# Patient Record
Sex: Female | Born: 1955 | Race: White | Hispanic: No | Marital: Married
Health system: Southern US, Community
[De-identification: ages and names within clinical notes are randomized; demographics above are authoritative.]

---

## 2012-06-30 ENCOUNTER — Emergency Department: Payer: Self-pay | Admitting: Emergency Medicine

## 2012-06-30 LAB — HEPATIC FUNCTION PANEL A (ARMC)
Albumin: 4.2 g/dL (ref 3.4–5.0)
Bilirubin, Direct: 0.5 mg/dL — ABNORMAL HIGH (ref 0.00–0.20)
Bilirubin,Total: 1.1 mg/dL — ABNORMAL HIGH (ref 0.2–1.0)
Total Protein: 7.6 g/dL (ref 6.4–8.2)

## 2012-06-30 LAB — CBC
HCT: 37.7 % (ref 35.0–47.0)
HGB: 13 g/dL (ref 12.0–16.0)
MCH: 32.3 pg (ref 26.0–34.0)
MCHC: 34.6 g/dL (ref 32.0–36.0)
MCV: 93 fL (ref 80–100)
Platelet: 243 10*3/uL (ref 150–440)
RDW: 13.3 % (ref 11.5–14.5)
WBC: 9.3 10*3/uL (ref 3.6–11.0)

## 2012-06-30 LAB — URINALYSIS, COMPLETE
Bilirubin,UR: NEGATIVE
Blood: NEGATIVE
Ketone: NEGATIVE
Nitrite: NEGATIVE
Protein: NEGATIVE
Specific Gravity: 1.02 (ref 1.003–1.030)
Squamous Epithelial: 2
WBC UR: 3 /HPF (ref 0–5)

## 2012-06-30 LAB — BASIC METABOLIC PANEL
Anion Gap: 8 (ref 7–16)
Chloride: 108 mmol/L — ABNORMAL HIGH (ref 98–107)
Co2: 25 mmol/L (ref 21–32)
Creatinine: 0.78 mg/dL (ref 0.60–1.30)
Osmolality: 282 (ref 275–301)
Potassium: 3.5 mmol/L (ref 3.5–5.1)
Sodium: 141 mmol/L (ref 136–145)

## 2012-07-03 ENCOUNTER — Inpatient Hospital Stay: Payer: Self-pay | Admitting: Surgery

## 2012-07-03 ENCOUNTER — Ambulatory Visit: Payer: Self-pay | Admitting: Surgery

## 2012-07-03 LAB — COMPREHENSIVE METABOLIC PANEL
Albumin: 4.1 g/dL (ref 3.4–5.0)
Anion Gap: 8 (ref 7–16)
BUN: 11 mg/dL (ref 7–18)
Bilirubin,Total: 1.1 mg/dL — ABNORMAL HIGH (ref 0.2–1.0)
Calcium, Total: 9.6 mg/dL (ref 8.5–10.1)
Chloride: 103 mmol/L (ref 98–107)
Co2: 29 mmol/L (ref 21–32)
Creatinine: 1.01 mg/dL (ref 0.60–1.30)
EGFR (African American): >60
Glucose: 114 mg/dL — ABNORMAL HIGH (ref 65–99)
Osmolality: 280 (ref 275–301)
SGPT (ALT): 506 U/L — ABNORMAL HIGH (ref 12–78)
Sodium: 140 mmol/L (ref 136–145)
Total Protein: 8.4 g/dL — ABNORMAL HIGH (ref 6.4–8.2)

## 2012-07-03 LAB — CBC WITH DIFFERENTIAL/PLATELET
Eosinophil #: 0.1 10*3/uL (ref 0.0–0.7)
HCT: 40.3 % (ref 35.0–47.0)
Lymphocyte #: 1.8 10*3/uL (ref 1.0–3.6)
Lymphocyte %: 34 %
MCV: 94 fL (ref 80–100)
Monocyte #: 0.4 x10 3/mm (ref 0.2–0.9)
Monocyte %: 8.1 %
Neutrophil %: 55.9 %
RBC: 4.31 10*6/uL (ref 3.80–5.20)
RDW: 13.3 % (ref 11.5–14.5)
WBC: 5.3 10*3/uL (ref 3.6–11.0)

## 2012-07-03 LAB — CREATININE, SERUM
EGFR (African American): >60
EGFR (Non-African Amer.): >60

## 2012-07-03 LAB — LIPASE, BLOOD: Lipase: 99 U/L (ref 73–393)

## 2012-07-05 LAB — CBC WITH DIFFERENTIAL/PLATELET
Basophil #: 0.1 10*3/uL (ref 0.0–0.1)
Basophil %: 0.7 %
HCT: 37.6 % (ref 35.0–47.0)
HGB: 12.5 g/dL (ref 12.0–16.0)
Lymphocyte #: 2.1 10*3/uL (ref 1.0–3.6)
Lymphocyte %: 24.7 %
MCHC: 33.1 g/dL (ref 32.0–36.0)
Monocyte #: 0.6 x10 3/mm (ref 0.2–0.9)
Monocyte %: 6.9 %
Neutrophil #: 5.7 10*3/uL (ref 1.4–6.5)
Neutrophil %: 67.1 %
Platelet: 228 10*3/uL (ref 150–440)
WBC: 8.5 10*3/uL (ref 3.6–11.0)

## 2012-07-05 LAB — BASIC METABOLIC PANEL
Anion Gap: 7 (ref 7–16)
BUN: 10 mg/dL (ref 7–18)
Calcium, Total: 8.7 mg/dL (ref 8.5–10.1)
Co2: 24 mmol/L (ref 21–32)
Creatinine: 0.91 mg/dL (ref 0.60–1.30)
EGFR (African American): >60
EGFR (Non-African Amer.): >60
Glucose: 100 mg/dL — ABNORMAL HIGH (ref 65–99)
Osmolality: 277 (ref 275–301)
Potassium: 3.7 mmol/L (ref 3.5–5.1)
Sodium: 139 mmol/L (ref 136–145)

## 2012-07-05 LAB — HEPATIC FUNCTION PANEL A (ARMC)
Alkaline Phosphatase: 257 U/L — ABNORMAL HIGH (ref 50–136)
Bilirubin, Direct: 0.4 mg/dL — ABNORMAL HIGH (ref 0.00–0.20)
Bilirubin,Total: 1.4 mg/dL — ABNORMAL HIGH (ref 0.2–1.0)
SGOT(AST): 222 U/L — ABNORMAL HIGH (ref 15–37)
SGPT (ALT): 425 U/L — ABNORMAL HIGH (ref 12–78)
Total Protein: 7.3 g/dL (ref 6.4–8.2)

## 2012-07-06 LAB — CBC WITH DIFFERENTIAL/PLATELET
Basophil #: 0 10*3/uL (ref 0.0–0.1)
Basophil %: 0.6 %
Eosinophil #: 0.1 10*3/uL (ref 0.0–0.7)
HCT: 35.7 % (ref 35.0–47.0)
Lymphocyte %: 28.7 %
MCH: 32.3 pg (ref 26.0–34.0)
MCHC: 34 g/dL (ref 32.0–36.0)
Monocyte #: 0.6 x10 3/mm (ref 0.2–0.9)
Monocyte %: 7.7 %
Neutrophil #: 4.8 10*3/uL (ref 1.4–6.5)
RBC: 3.74 10*6/uL — ABNORMAL LOW (ref 3.80–5.20)
RDW: 13.7 % (ref 11.5–14.5)
WBC: 7.7 10*3/uL (ref 3.6–11.0)

## 2012-07-06 LAB — BASIC METABOLIC PANEL
Creatinine: 0.86 mg/dL (ref 0.60–1.30)
EGFR (Non-African Amer.): >60
Glucose: 105 mg/dL — ABNORMAL HIGH (ref 65–99)
Osmolality: 280 (ref 275–301)
Sodium: 141 mmol/L (ref 136–145)

## 2012-07-06 LAB — HEPATIC FUNCTION PANEL A (ARMC)
Bilirubin, Direct: 0.4 mg/dL — ABNORMAL HIGH (ref 0.00–0.20)
Bilirubin,Total: 1.3 mg/dL — ABNORMAL HIGH (ref 0.2–1.0)
SGOT(AST): 123 U/L — ABNORMAL HIGH (ref 15–37)
SGPT (ALT): 310 U/L — ABNORMAL HIGH (ref 12–78)

## 2012-07-06 LAB — LIPASE, BLOOD: Lipase: 107 U/L (ref 73–393)

## 2014-07-07 IMAGING — CT CT STONE STUDY
1 of 2 series · 15 of 32 positions shown, 19 images · non-contrast
Comparison: none

REASON FOR EXAM: right flank pain, no hx of stones
COMMENTS:

PROCEDURE:     CT ABDOMEN/PELVIS WO (STONE) 06/30/2012 [DATE]
RESULT:
TECHNIQUE: Helical noncontrasted 3 mm sections were obtained from the lung
bases through the pubic symphysis.

[Series 2: 3mm soft tissue · axial · 0.68mm/px · z∈[-432,-27]mm · 15 of 149 slices shown, 19 images]
[im 7/149  soft-tissue]
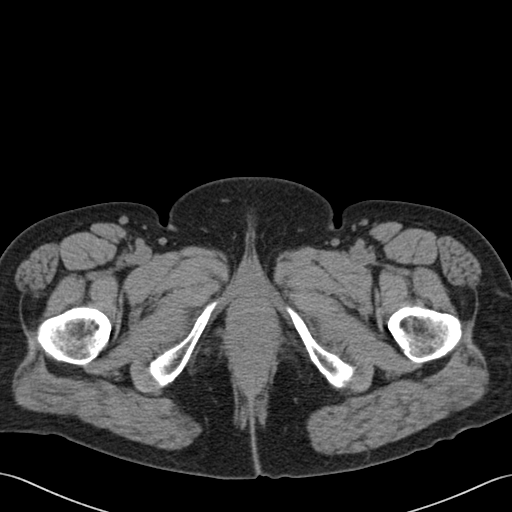
[im 7/149  bone]
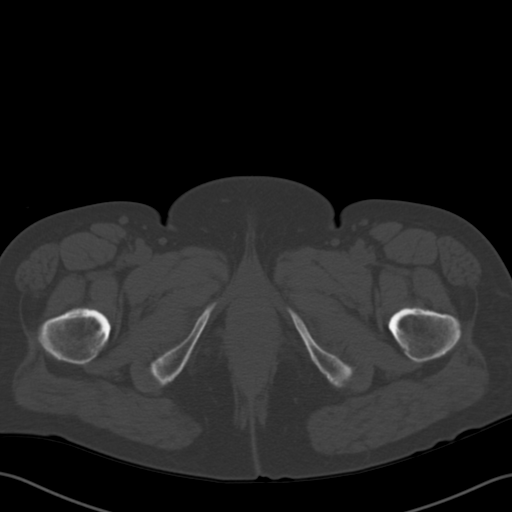
[im 20/149  soft-tissue]
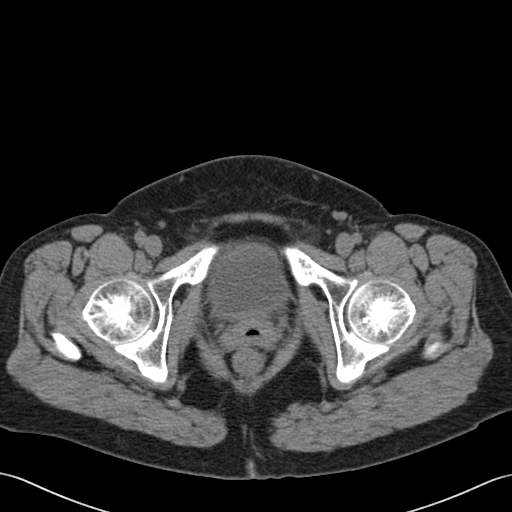
[im 33/149  soft-tissue]
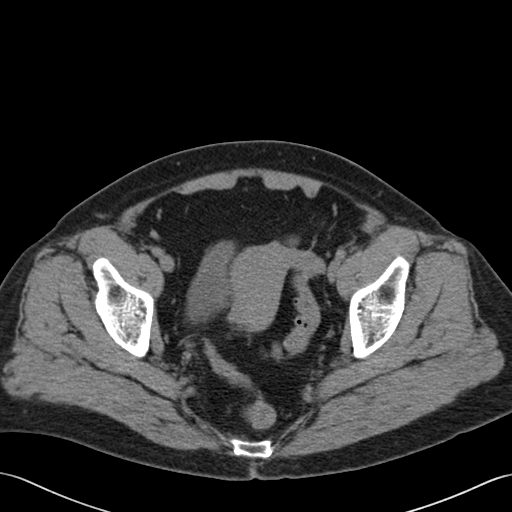
[im 39/149  soft-tissue]
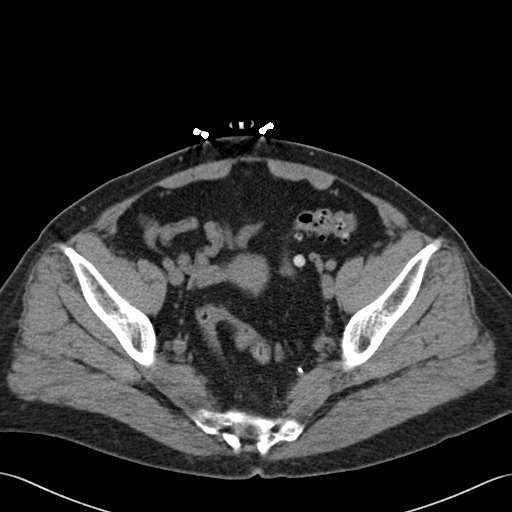
[im 52/149  soft-tissue]
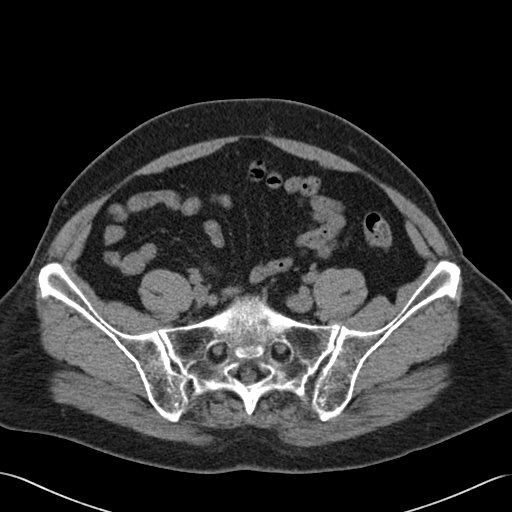
[im 65/149  soft-tissue]
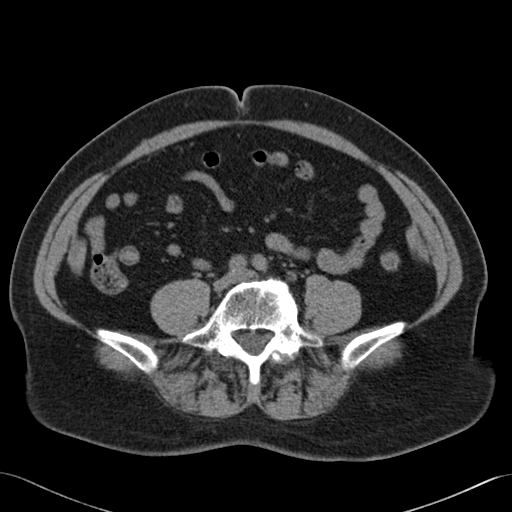
[im 78/149  soft-tissue]
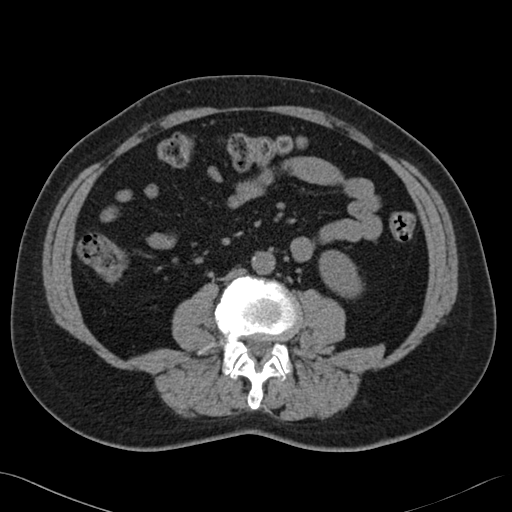
[im 84/149  soft-tissue]
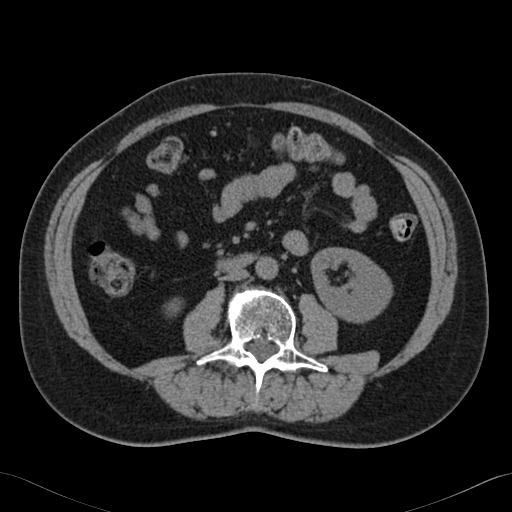
[im 97/149  soft-tissue]
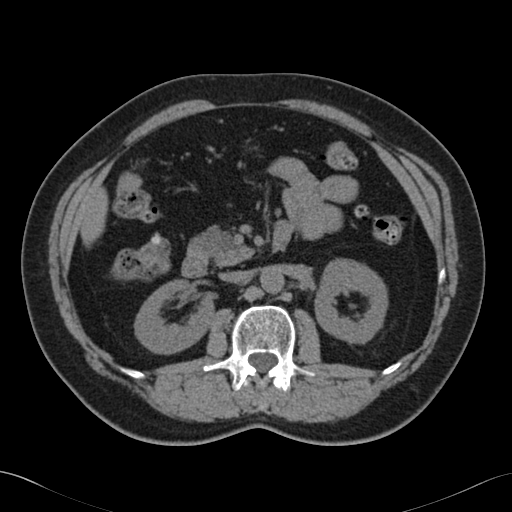
[im 97/149  bone]
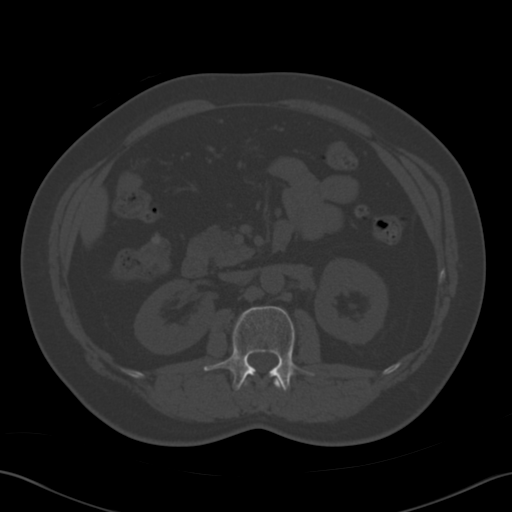
[im 110/149  soft-tissue]
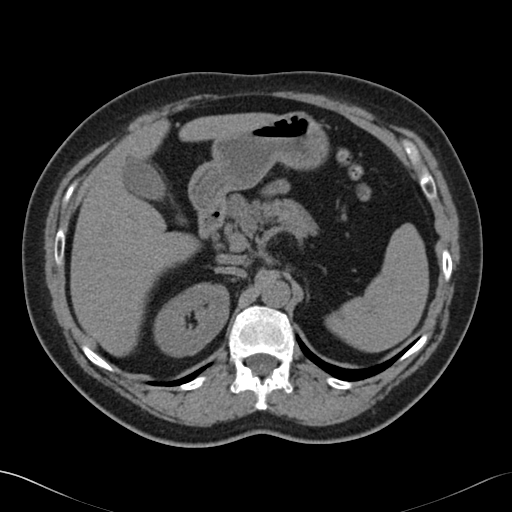
[im 116/149  soft-tissue]
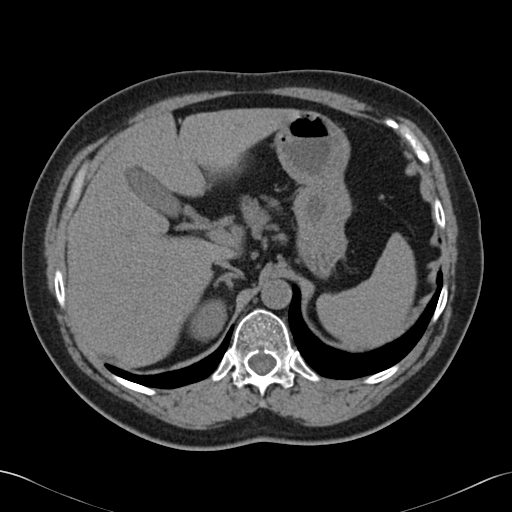
[im 123/149  lung]
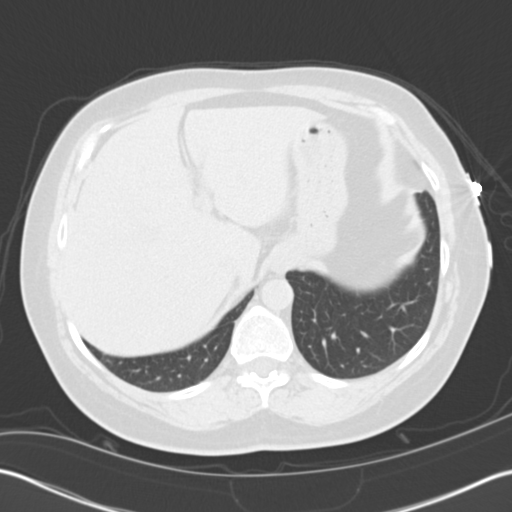
[im 129/149  soft-tissue]
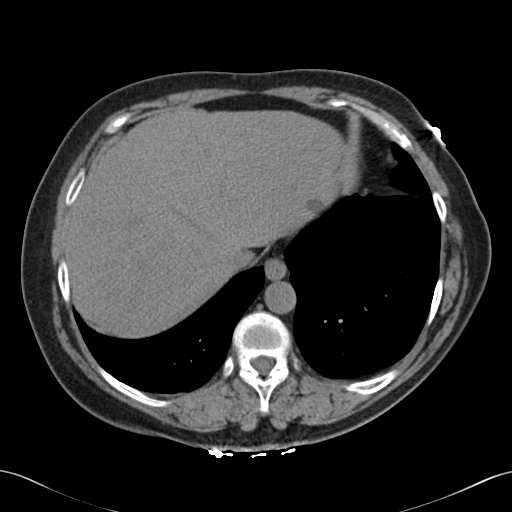
[im 129/149  lung]
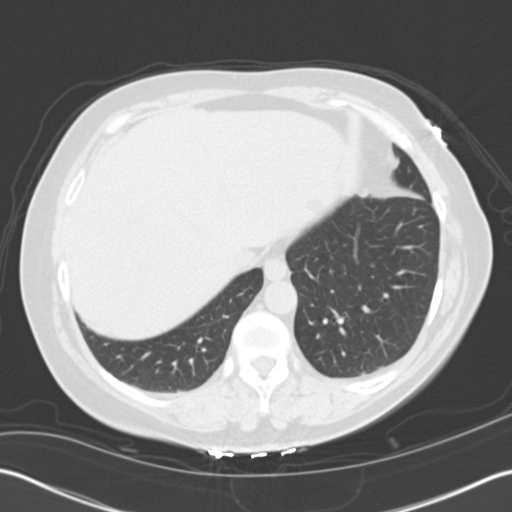
[im 136/149  lung]
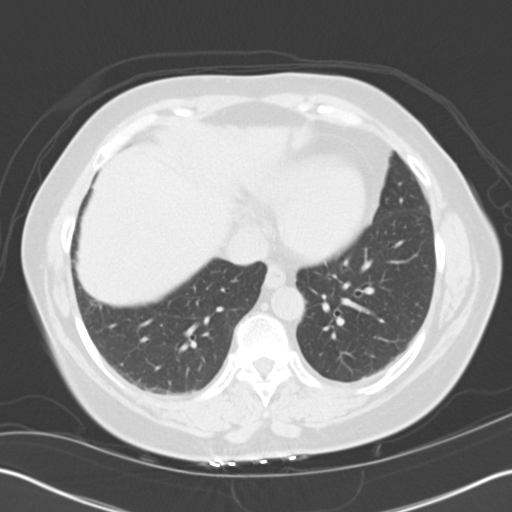
[im 142/149  soft-tissue]
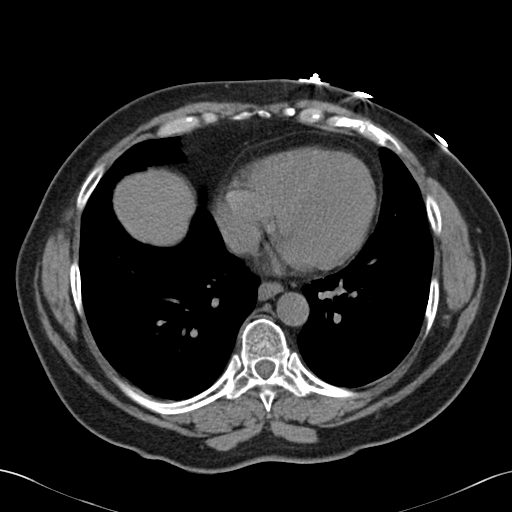
[im 142/149  lung]
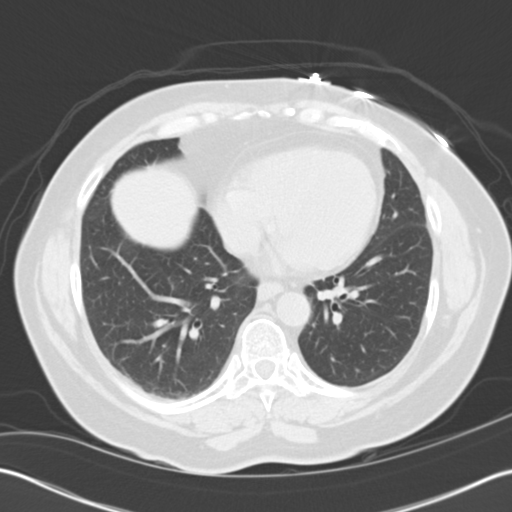

[15 of 32 positions shown; findings below may reference images not displayed]

FINDINGS: The lung bases are unremarkable.

Evaluation of the liver demonstrates a small 1 cm low attenuating focus in
the dome of the left lobe of the liver likely representing a cyst.

Noncontrast evaluation of the spleen, adrenals, pancreas, and kidneys are
unremarkable. Calcified gallstones are identified within the gallbladder.
There is a suggestion of minimal pericholecystic stranding which may
represent mild inflammation. There is no CT evidence of bowel obstruction,
enteritis, or colitis. There is no evidence of an abdominal aortic aneurysm,
free fluid or loculated fluid collections.
IMPRESSION: 1. Cholelithiasis with possible mild pericholecystic stranding. Abdominal
ultrasound may be helpful to further characterize this finding.
2. No furthers abnormalities are appreciated.
3. Dr. Kamran of the Emergency Department was informed of these findings via
a preliminary faxed report.

## 2014-09-11 NOTE — Op Note (Signed)
PATIENT NAME:  Wendy Escobar, Kenlee F MR#:  [id number] DATE OF BIRTH:  16-Dec-1955  DATE OF PROCEDURE:  07/04/2012  PREOPERATIVE DIAGNOSIS: Cholecystitis and cholelithiasis.   POSTOPERATIVE DIAGNOSIS: Cholecystitis, cholelithiasis and choledocholithiasis.   SURGEON: Dr. Michela PitcherEly   OPERATIVE PROCEDURE: Laparoscopic cholecystectomy with cholangiography.   ANESTHESIA: General.   OPERATIVE PROCEDURE: With the patient in the supine position after induction of appropriate general anesthesia, the patient's abdomen was prepped with ChloraPrep and draped with sterile towels. The patient was placed in the head down, feet up position. A small transverse infraumbilical incision was made at the site of her previous BTL and carried down through the subcutaneous tissue with Bovie electrocautery. Midline fascia was identified. The abdomen was cannulated with a Veress needle with CO2 insufflated to appropriate pressure measurements. When approximately 2.5 L of CO2 were instilled, the Veress needle was withdrawn. An 11-mm [employer] port was inserted into the peritoneal cavity. Intraperitoneal position was confirmed, and CO2 was reinsufflated. The patient was placed in the head up, feet down position with a slight to left side. A subxiphoid transverse incision was made and an 11-mm port inserted under direct vision. Two lateral ports 5 mm in size were inserted under direct vision. The gallbladder was tense and distended. It was obviously edematous. It was drained of approximately 50 mL of dirty motor oil-colored bile. The gallbladder was retracted superiorly and laterally, exposing the hepatoduodenal ligament. The cystic duct and cystic artery were identified. The cystic duct was quite small. It was clipped on the gallbladder side and opened. An on-table cholangiogram using dynamic fluoroscopy revealed free flow of dye into the duodenum. There did appear to be distal obstruction floating in the common bile duct. No other  obstruction was identified. Intrahepatic radicles were seen. The catheter was withdrawn. The cystic duct was triply clipped on the common duct side and divided. The cystic artery was doubly clipped and divided. The gallbladder was then dissected free from its bed and delivered using hook and cautery apparatus. Once the gallbladder was free, the gallbladder was retrieved with an EndoCatch apparatus in the subxiphoid incision. The abdomen was then copiously irrigated. The upper midline defect was closed with figure-of-eight sutures of 0 Vicryl closed with a suture passer. The abdomen was then desufflated. All skin incisions were closed with 5-0 nylon. The area was infiltrated with 0.25% Marcaine for postoperative pain control. Sterile dressings were applied. The patient was returned to the recovery room having tolerated the procedure well. Sponge, instrument and needle counts were correct x2 in the Operating Room.    ____________________________ Carmie Endalph L. Ely III, MD rle:lg D: 07/04/2012 15:42:42 ET T: 07/04/2012 16:47:00 ET JOB#: [id number]  cc: Carmie Endalph L. Ely III, MD, <Dictator> [facility] Quentin OreALPH L ELY MD ELECTRONICALLY SIGNED 07/12/2012 17:19

## 2014-09-11 NOTE — H&P (Signed)
PATIENT NAME:  Wendy KassSTANLEY, Wendy F MR#:  [id number] DATE OF BIRTH:  [birth year]  DATE OF ADMISSION:  07/03/2012  CHIEF COMPLAINT: Right back pain.   HISTORY OF PRESENT ILLNESS: This is a 59 year old female patient who started having right back pain 4 or 5 days ago, on Saturday. She went to the Emergency Room on Sunday. She has been nauseated, had a single emesis on Sunday, none since. She is slightly improved, but when she was in the Emergency Room she had elevated liver function tests and since then has had dark urine. She has not had a bowel movement and is constipated; therefore, we have no information about acholic stools. She denies prior episodes prior to Saturday. States that her pain is mostly in her back, but somewhat in her right upper quadrant. She has had no fevers or chills and relates this to fatty food intolerance making it worse.   PAST MEDICAL HISTORY: None.   PAST SURGICAL HISTORY: Tubal ligation.   ALLERGIES: No known drug allergies.   MEDICATIONS: None.   FAMILY HISTORY: Noncontributory.   SOCIAL HISTORY: She stopped smoking 1 year ago, has a 20 pack-year history, does not drink alcohol and is employed.   REVIEW OF SYSTEMS: Ten system review has been performed and negative and documented in the office chart.   PHYSICAL EXAMINATION: GENERAL: Healthy-appearing female patient.  VITAL SIGNS: Stable. She is afebrile.  HEENT: No scleral icterus.  NECK: No palpable neck nodes.  CHEST: Clear to auscultation.  CARDIAC: Regular rate and rhythm.  ABDOMEN: Soft, minimally tender in the right upper quadrant with a negative Murphy's sign. No mass palpable. An infraumbilical scar is noted.  EXTREMITIES: Without edema. Calves are nontender.  NEUROLOGIC: Grossly intact.  INTEGUMENT: No jaundice.   LABORATORY DATA: Values drawn in the ED were elevated; AST, ALT and alkaline phosphatase.   RADIOLOGIC DATA: Ultrasound showed gallstones.   ASSESSMENT AND PLAN: This is a patient with  probable choledocholithiasis by history. She had elevated liver function tests and now has dark urine. My recommendations are to repeat her labs today and if they are worse or persistently elevated she should go back to the hospital for admission. We will call her with those results. I discussed with she and her husband the rationale for surgical admission with planned laparoscopic cholecystectomy with cholangiography during that admission with the possibility of a preoperative ERCP depending on the results of the liver function tests. We will call her later on today when we get those results. ____________________________ Adah Salvageichard E. Excell Seltzerooper, MD rec:sb D: 07/03/2012 10:17:35 ET T: 07/03/2012 10:25:30 ET JOB#: [id number]  cc: Adah Salvageichard E. Excell Seltzerooper, MD, <Dictator> Lattie HawICHARD E Makalah Asberry MD ELECTRONICALLY SIGNED 07/05/2012 20:42

## 2014-09-11 NOTE — Discharge Summary (Signed)
PATIENT NAME:  Wendy KassSTANLEY, Wendy F MR#:  [id number] DATE OF BIRTH:  [birth year]  DATE OF ADMISSION:  07/03/2012 DATE OF DISCHARGE:  07/07/2012  HISTORY OF PRESENT ILLNESS:  The patient is very pleasant 59 year old woman admitted from the office with symptoms consistent with biliary colic, possible acute cholecystitis and possible common bile duct obstruction. She had had symptoms for several days, seen in the Emergency Room and referred to the office for further evaluation. Office laboratory values revealed a slightly elevated alkaline phosphatase at 242, elevated transaminases to 225 and 506 with an elevated bilirubin of 1.1. White blood cell count was normal. She was admitted to the hospital, placed on IV antibiotics. After appropriate preoperative preparation and informed consent, she was taken to surgery on the morning of 07/04/2012, where she underwent a laparoscopic cholecystectomy with cholangiography. She did have evidence of acute cholecystitis and cholangiography did identify what appeared to be choledocholithiasis. The problem was discussed with the patient's husband. She recovered uneventfully from surgery. The following day on the 14th she was seen by the GI service and Dr. Niel HummerIftikhar performed an ERCP which retrieving the stone identified on cholangiography.  She had very slow return of bowel function and some mild nausea problems. Pain control was an issue over the next 24 hours.  This morning she is up, active, tolerating a diet with no complaints. The wounds look good. There is no sign of any infection. She is discharged home today to be followed in the office in 7 to 10 days' time. Bathing, activity and driving instructions were given to the patient.   DISCHARGE MEDICATIONS: She is to resume her Vicodin 5/325 every 4 hours p.r.n. and Nasonex nasal spray 2 sprays as necessary.   FINAL DISCHARGE DIAGNOSIS: Acute cholecystitis with choledocholithiasis.   PROCEDURE: Laparoscopic cholecystectomy  with cholangiography and endoscopic retrograde cholangiopancreatography.  ____________________________ Carmie Endalph L. Ely III, MD rle:ct D: 07/07/2012 10:33:02 ET T: 07/07/2012 12:19:31 ET JOB#: [id number]  cc: Lurline DelShaukat Iftikhar, MD Quentin Orealph L. Ely III, MD, <Dictator>

## 2014-09-11 NOTE — Consult Note (Signed)
Brief Consult Note: Diagnosis: CBD stone.   Patient was seen by consultant.   Comments: Patient s/p cholecystectomy. IOC showed a smnall CBD stone. Patient continues to c/o RUQ pain and dark urine. LFT's are moderately abnormal with a total bilirubin of 1.4.  Recommendations: Agree with ERCP and will proceed today. Discussed with patient and her husband and they are in full agreement.  Electronic Signatures: Lurline DelIftikhar, Martell Mcfadyen (MD)  (Signed 14-Feb-14 10:41)  Authored: Brief Consult Note   Last Updated: 14-Feb-14 10:41 by Lurline DelIftikhar, Danni Shima (MD)

## 2014-09-11 NOTE — Consult Note (Signed)
PATIENT NAME:  Wendy KassSTANLEY, Wendy F MR#:  [id number] DATE OF BIRTH:  [birth year]  DATE OF CONSULTATION:  07/05/2012  REFERRING PHYSICIAN:  Dr. Marshia Lyandy Ely.  CONSULTING PHYSICIAN:  Lurline DelShaukat Lyfe Reihl, MD  REASON FOR CONSULTATION: Common bile duct stone.   HISTORY OF PRESENT ILLNESS:  A 59 year old female came to the Emergency Room with typical symptoms of acute cholecystitis. Ultrasound showed gallstones with somewhat thickened gallbladder wall and moderate elevation of liver enzymes with a total bilirubin of 1.1. The patient underwent cholecystectomy yesterday. Intraoperative cholangiogram showed a single distal common bile duct stone. Gastroenterology was consulted. The patient was evaluated this morning. Still complaining of right upper quadrant pain. According to her, her urine is dark. She is somewhat nauseated, but denies any vomiting. Bilirubin today is 1.4, AST and ALT are moderately elevated.   PAST MEDICAL HISTORY: Quite insignificant except for tubal ligation.   ALLERGIES: None.   MEDICATIONS: None.   FAMILY AND SOCIAL HISTORY:  Unremarkable.   REVIEW OF SYSTEMS:  Grossly negative except for what is mentioned in the History of Present Illness.   PHYSICAL EXAMINATION: GENERAL:  Somewhat obese female, does not appear to be in any acute distress. She has been afebrile. Heart rate is in the 70s, respiration 18, blood pressure 125/66. Clinically, she does not appear to be jaundiced.  NECK:  Veins flat.  LUNGS:  Grossly clear to auscultation bilaterally with fair air entry and clear breath sounds.  CARDIOVASCULAR:  Regular rate and rhythm.  ABDOMEN:  Showed somewhat distended abdomen. Bowel sounds are very sluggish. Tenderness was noted in the right upper quadrant area. No rebound was noted.  NEUROLOGIC:  Examination appears to be grossly unremarkable.   LABORATORY, DIAGNOSTIC AND RADIOLOGICAL DATA:  Ultrasound showed somewhat thickened gallbladder wall, as well as multiple small gallstones.  Intraoperative cholangiogram is consistent with a small filling defect in the distal common bile duct. CBC within normal limits today. Bilirubin is 1.4, alkaline phosphatase is 257, ALT 425, AST 222.   ASSESSMENT AND PLAN:  The patient with choledocholithiasis. No signs of cholangitis. Agree with Dr. Michela PitcherEly that an ERCP would be helpful. To proceed with ERCP was discussed in detail with the patient, as well as her husband. The potential complications including but not limited to bleeding, perforation and acute pancreatitis were discussed.  The acute post ERCP pancreatitis was discussed, in particular, along with the fact that some cases of acute pancreatitis may be fatal.  Alternatives, etc., were discussed. They are both in agreement. Further recommendations to follow.    ____________________________ Lurline DelShaukat Sims Laday, MD si:dm D: 07/05/2012 12:50:44 ET T: 07/05/2012 13:00:39 ET JOB#: [id number]  cc: Lurline DelShaukat Bona Hubbard, MD, <Dictator> Lurline DelSHAUKAT Darrall Strey MD ELECTRONICALLY SIGNED 07/18/2012 12:14

## 2014-09-11 NOTE — Consult Note (Signed)
Chief Complaint:  Subjective/Chief Complaint Much better with minimal pain. LFT's improving. Abdomen is soft and benign. Agree with advancing diet.   Electronic Signatures: Lurline DelIftikhar, Dionisio Aragones (MD)  (Signed 15-Feb-14 12:14)  Authored: Chief Complaint   Last Updated: 15-Feb-14 12:14 by Lurline DelIftikhar, Nithya Meriweather (MD)
